# Patient Record
Sex: Female | Born: 1995 | Race: White | Hispanic: No | Marital: Single | Smoking: Never smoker
Health system: Southern US, Community
[De-identification: ages and names within clinical notes are randomized; demographics above are authoritative.]

## PROBLEM LIST (undated history)

## (undated) DIAGNOSIS — I10 Essential (primary) hypertension: Secondary | ICD-10-CM

---

## 2014-10-06 ENCOUNTER — Emergency Department (HOSPITAL_COMMUNITY)
Admission: EM | Admit: 2014-10-06 | Discharge: 2014-10-06 | Disposition: A | Discharge status: Home / Self Care | Payer: Medicaid Other | Attending: Emergency Medicine | Admitting: Emergency Medicine

## 2014-10-06 ENCOUNTER — Encounter (HOSPITAL_COMMUNITY): Payer: Self-pay | Admitting: Emergency Medicine

## 2014-10-06 DIAGNOSIS — Z3202 Encounter for pregnancy test, result negative: Secondary | ICD-10-CM | POA: Insufficient documentation

## 2014-10-06 DIAGNOSIS — I10 Essential (primary) hypertension: Secondary | ICD-10-CM | POA: Diagnosis not present

## 2014-10-06 DIAGNOSIS — Z880 Allergy status to penicillin: Secondary | ICD-10-CM | POA: Diagnosis not present

## 2014-10-06 DIAGNOSIS — R1010 Upper abdominal pain, unspecified: Secondary | ICD-10-CM | POA: Diagnosis present

## 2014-10-06 DIAGNOSIS — R1013 Epigastric pain: Secondary | ICD-10-CM | POA: Diagnosis not present

## 2014-10-06 DIAGNOSIS — R109 Unspecified abdominal pain: Secondary | ICD-10-CM

## 2014-10-06 HISTORY — DX: Essential (primary) hypertension: I10

## 2014-10-06 LAB — URINALYSIS, ROUTINE W REFLEX MICROSCOPIC
GLUCOSE, UA: 100 mg/dL — AB
HGB URINE DIPSTICK: NEGATIVE
Nitrite: POSITIVE — AB
PROTEIN: 30 mg/dL — AB
UROBILINOGEN UA: 4 mg/dL — AB (ref 0.0–1.0)
pH: 6.5 (ref 5.0–8.0)

## 2014-10-06 LAB — URINE MICROSCOPIC-ADD ON

## 2014-10-06 LAB — PREGNANCY, URINE: Preg Test, Ur: NEGATIVE

## 2014-10-06 MED ORDER — HYDROCODONE-ACETAMINOPHEN 5-325 MG PO TABS
ORAL_TABLET | ORAL | Status: AC
  Filled 2014-10-06: qty 1

## 2014-10-06 MED ORDER — HYDROCODONE-ACETAMINOPHEN 5-325 MG PO TABS
1.0000 | ORAL_TABLET | Freq: Once | ORAL | Status: AC
  Administered 2014-10-06: 1 via ORAL

## 2014-10-06 NOTE — Discharge Instructions (Signed)

## 2014-10-06 NOTE — ED Notes (Signed)
Went into room to start IV and draw blood -Pt declined, Stated that she really didn't want blood drawn - Lab informed

## 2014-10-06 NOTE — ED Notes (Signed)
Patient given discharge instruction, verbalized understand. Patient ambulatory out of the department.  

## 2014-10-06 NOTE — ED Notes (Signed)
Patient complaining of upper abdominal pain radiating to back. States started hurting her on Saturday. Reports has had problems with her gallbladder in the past.

## 2014-10-07 ENCOUNTER — Ambulatory Visit (HOSPITAL_COMMUNITY)
Admit: 2014-10-07 | Discharge: 2014-10-07 | Disposition: A | Discharge status: Home / Self Care | Payer: Medicaid Other | Source: Ambulatory Visit | Attending: Emergency Medicine | Admitting: Emergency Medicine

## 2014-10-07 DIAGNOSIS — K8020 Calculus of gallbladder without cholecystitis without obstruction: Secondary | ICD-10-CM | POA: Insufficient documentation

## 2014-10-07 DIAGNOSIS — K838 Other specified diseases of biliary tract: Secondary | ICD-10-CM | POA: Insufficient documentation

## 2014-10-07 DIAGNOSIS — R109 Unspecified abdominal pain: Secondary | ICD-10-CM

## 2014-10-07 DIAGNOSIS — R1032 Left lower quadrant pain: Secondary | ICD-10-CM | POA: Diagnosis not present

## 2014-10-07 DIAGNOSIS — R1031 Right lower quadrant pain: Secondary | ICD-10-CM | POA: Diagnosis not present

## 2014-10-07 DIAGNOSIS — R1011 Right upper quadrant pain: Secondary | ICD-10-CM | POA: Diagnosis present

## 2014-10-07 NOTE — ED Provider Notes (Signed)
Ultrasound of gallbladder shows cholelithiasis.  No acute abdomen. Patient referred to Dr. Franky Macho, general surgery. Recommended low-fat diet. Discussed with patient and her husband  Donnetta Hutching, MD 10/07/14 1057

## 2014-10-08 LAB — URINE CULTURE

## 2014-11-17 NOTE — ED Provider Notes (Signed)
CSN: [id number]     Arrival date & time 10/06/14  2022 History   First MD Initiated Contact with Patient 10/06/14 2217     Chief Complaint  Patient presents with  . Abdominal Pain     (Consider location/radiation/quality/duration/timing/severity/associated sxs/prior Treatment) HPI   Tammy Padilla is a 19 y.o. female who presents for evaluation of upper abdominal pain. Pain present for several days and intermittent. She has known gallbladder disease. No imaging has been in this facility. She denies fever, chills, nausea, vomiting, weakness or dizziness. There are no other known modifying factors.  Past Medical History  Diagnosis Date  . Hypertension    Past Surgical History  Procedure Laterality Date  . Cesarean section     History reviewed. No pertinent family history. Social History  Substance Use Topics  . Smoking status: Never Smoker   . Smokeless tobacco: None  . Alcohol Use: No   OB History    No data available     Review of Systems  All other systems reviewed and are negative.     Allergies  Penicillins  Home Medications   Prior to Admission medications   Not on File   BP 122/69 mmHg  Pulse 84  Temp(Src) 98.7 F (37.1 C) (Oral)  Resp 20  Ht 5\' 5"  (1.651 m)  Wt 248 lb 9.6 oz (112.764 kg)  BMI 41.37 kg/m2  SpO2 99%  LMP 08/05/2014 Physical Exam  Constitutional: She is oriented to person, place, and time. She appears well-developed and well-nourished.  HENT:  Head: Normocephalic and atraumatic.  Right Ear: External ear normal.  Left Ear: External ear normal.  Eyes: Conjunctivae and EOM are normal. Pupils are equal, round, and reactive to light.  Neck: Normal range of motion and phonation normal. Neck supple.  Cardiovascular: Normal rate, regular rhythm and normal heart sounds.   Pulmonary/Chest: Effort normal and breath sounds normal. She exhibits no bony tenderness.  Abdominal: Soft. There is tenderness (epigastric, mild).  Musculoskeletal: Normal  range of motion.  Neurological: She is alert and oriented to person, place, and time. No cranial nerve deficit or sensory deficit. She exhibits normal muscle tone. Coordination normal.  Skin: Skin is warm, dry and intact.  Psychiatric: She has a normal mood and affect. Her behavior is normal. Judgment and thought content normal.  Nursing note and vitals reviewed.   ED Course  Procedures (including critical care time) Medications  HYDROcodone-acetaminophen (NORCO/VICODIN) 5-325 MG per tablet 1 tablet (1 tablet Oral Given 10/06/14 2340)    No data found.  Screening evaluation ordered by Dr. Clarene DukeMcManus.  Patient decided she did not want to get her blood drawn, so chose to left without further evaluation.  Follow-up outpatient ultrasound ordered for next day.  Labs Review Labs Reviewed  URINALYSIS, ROUTINE W REFLEX MICROSCOPIC (NOT AT [facility]) - Abnormal; Notable for the following:    Color, Urine ORANGE (*)    APPearance HAZY (*)    Specific Gravity, Urine >1.030 (*)    Glucose, UA 100 (*)    Bilirubin Urine LARGE (*)    Ketones, ur TRACE (*)    Protein, ur 30 (*)    Urobilinogen, UA 4.0 (*)    Nitrite POSITIVE (*)    Leukocytes, UA MODERATE (*)    All other components within normal limits  URINE MICROSCOPIC-ADD ON - Abnormal; Notable for the following:    Squamous Epithelial / LPF MANY (*)    Bacteria, UA MANY (*)    Crystals CA OXALATE CRYSTALS (*)  All other components within normal limits  URINE CULTURE  PREGNANCY, URINE    Imaging Review No results found. I have personally reviewed and evaluated these images and lab results as part of my medical decision-making.   EKG Interpretation None      MDM   Final diagnoses:  Flank pain    Nonspecific upper abdominal pain with possibility for gallstone disease. No overt toxic metabolic processes.  Patient is choosing to leave and have further evaluation done as an outpatient.  Nursing Notes Reviewed/ Care  Coordinated Applicable Imaging Reviewed Interpretation of Laboratory Data incorporated into ED treatment  The patient appears reasonably screened and/or stabilized for discharge and I doubt any other medical condition or other [facility] requiring further screening, evaluation, or treatment in the ED at this time prior to discharge.  Plan: Home Medications- none; Home Treatments- rest; return here if the recommended treatment, does not improve the symptoms; Recommended follow up- OP Abdominal U/S, next day     Mancel Bale, MD 11/17/14 [phone]

## 2016-04-26 IMAGING — US US ABDOMEN COMPLETE
1 series · 13 of 25 positions shown · non-contrast
Comparison: None.

CLINICAL DATA: Right upper quadrant, right flank pain, and left
flank pain. History of nausea vomiting.

EXAM:
ULTRASOUND ABDOMEN COMPLETE

[Series 1: us abdomen complete · 0.21mm/px · 13 of 136 slices shown]
[im 1/136]
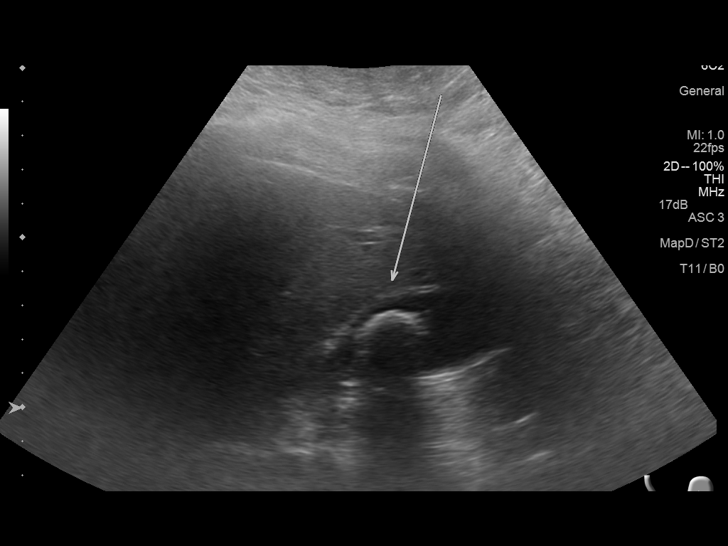
[im 12/136]
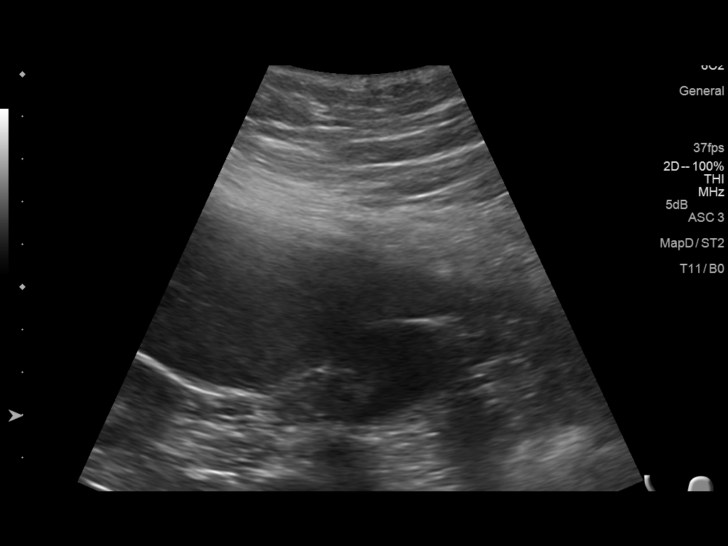
[im 23/136]
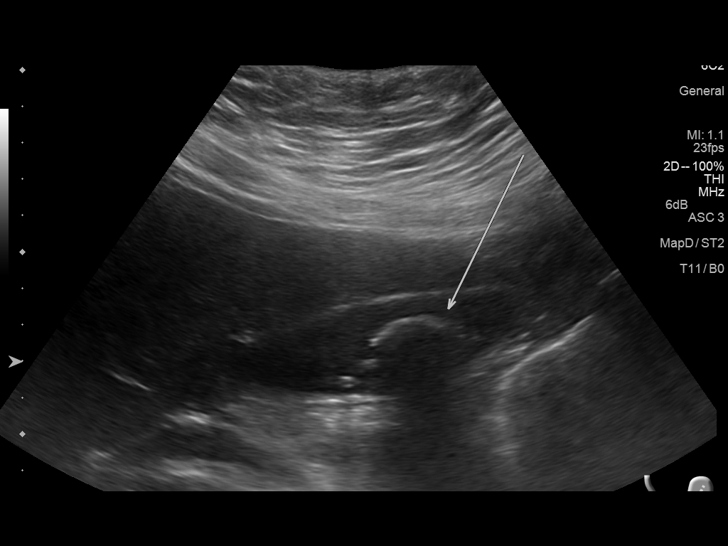
[im 34/136]
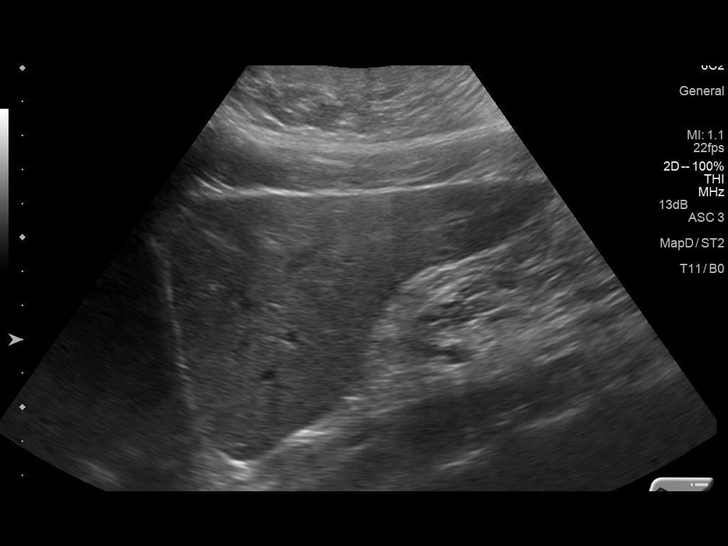
[im 46/136]
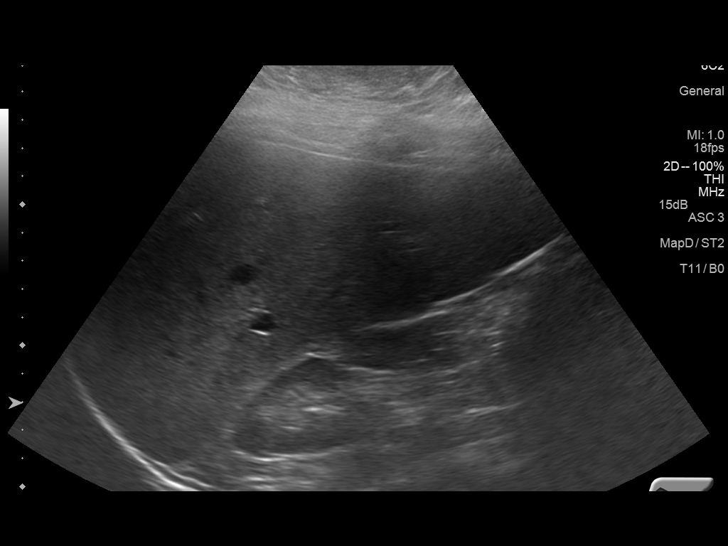
[im 57/136]
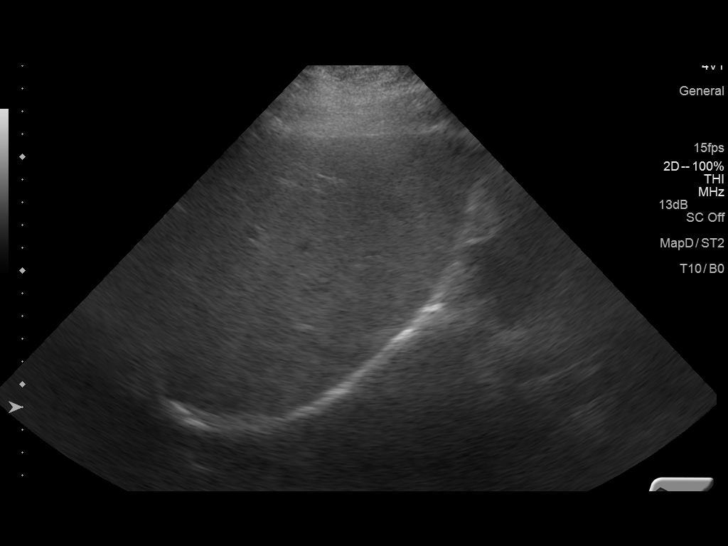
[im 68/136]
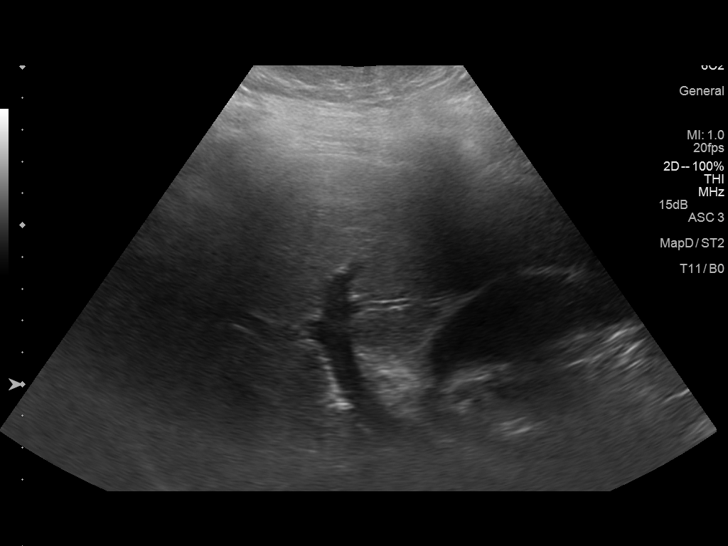
[im 79/136]
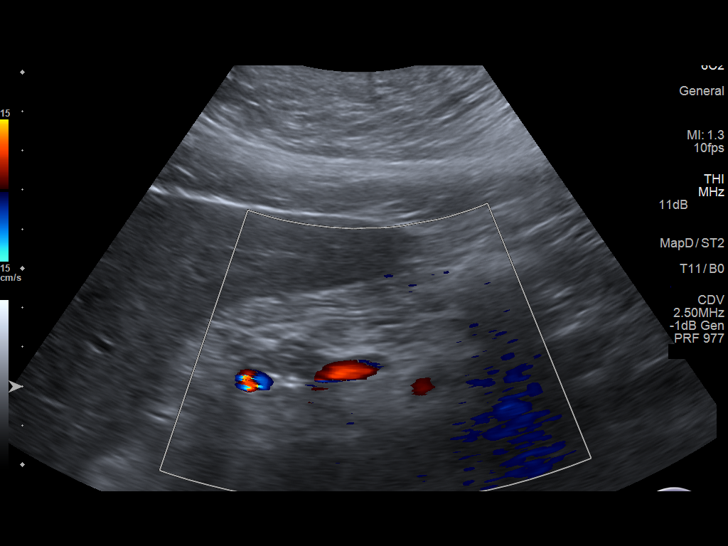
[im 91/136]
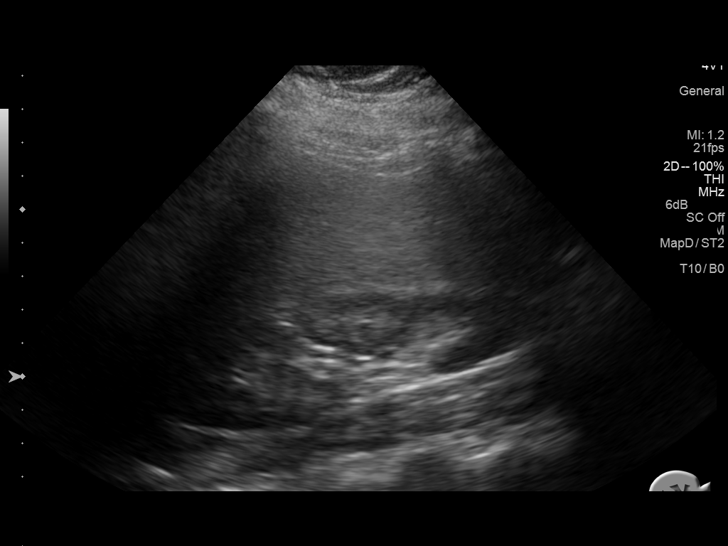
[im 102/136]
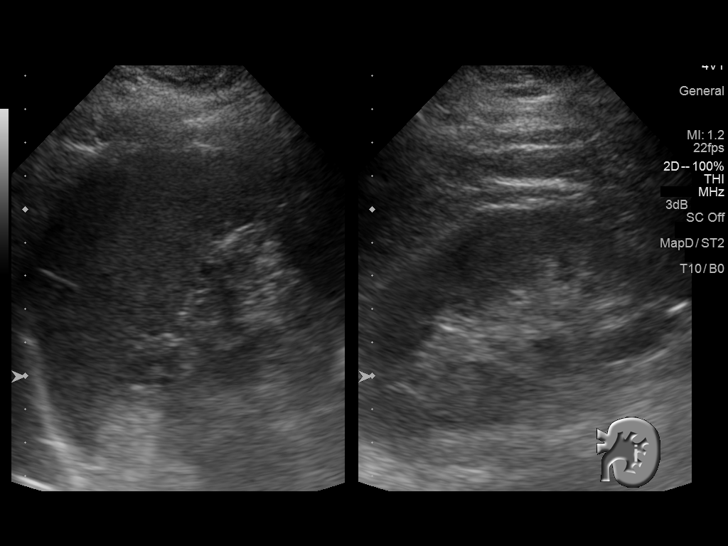
[im 113/136]
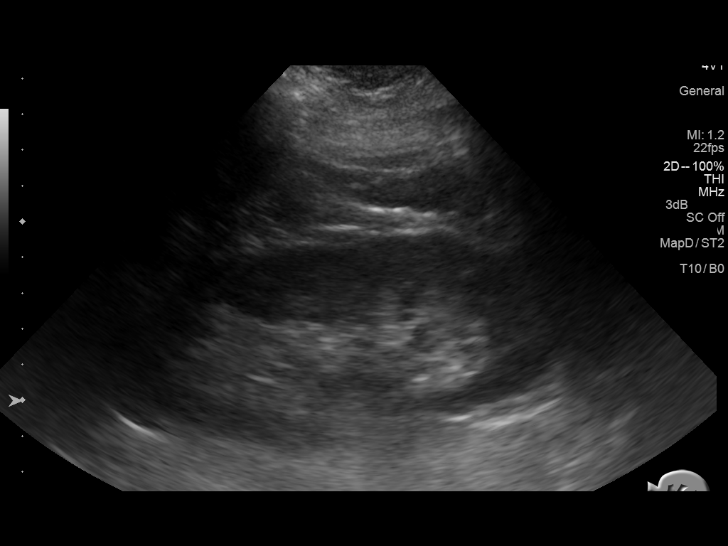
[im 124/136]
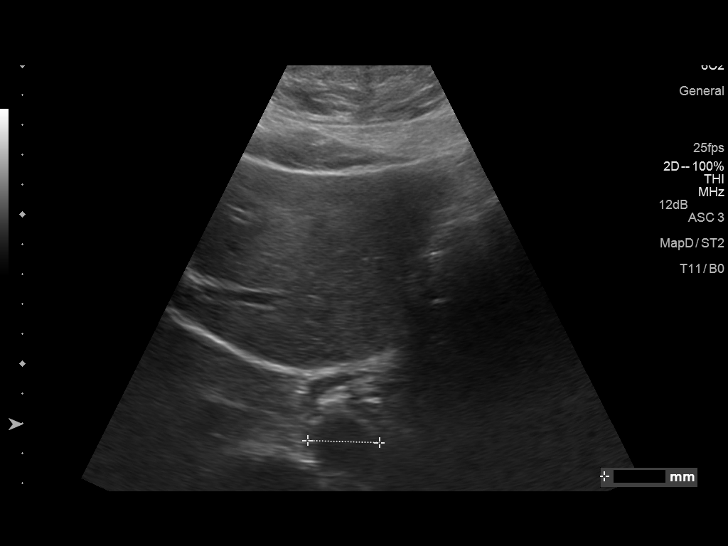
[im 136/136]
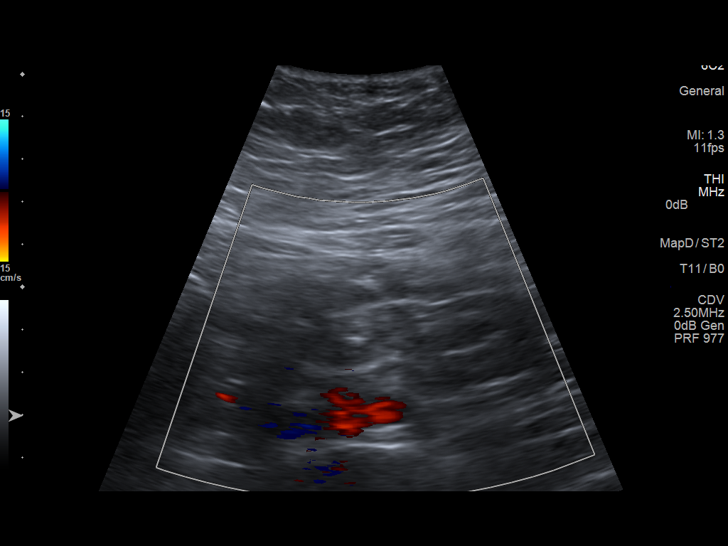

[13 of 25 positions shown; findings below may reference images not displayed]

FINDINGS: Gallbladder: There are multiple mobile gallstones the largest of
which measures 2.2 cm. Layering with changing of positioning is
noted. Sonographic Murphy's sign is negative. There is marginal
thickening of the gallbladder wall which measures 3.4 mm.

Common bile duct: Diameter:

Liver: No focal lesion identified. Within normal limits in
parenchymal echogenicity.

IVC: No abnormality visualized.

Pancreas: Visualized portion unremarkable. Pancreatic tail is
obscured by bowel gas.

Spleen: Size and appearance within normal limits, measuring 8.4 cm
in length.

Right Kidney: Length: 12.0. Echogenicity within normal limits. No
mass or hydronephrosis visualized.

Left Kidney: Length: 13.1. Echogenicity within normal limits. No
mass or hydronephrosis visualized.

Abdominal aorta: Partially obscured by bowel gas. No aneurysm where
visualized. Proximal abdominal aorta measures 2.2 cm in transverse
diameter.

Other findings: None.
IMPRESSION: Cholelithiasis. Mild thickening of the gallbladder wall, which may
be seen with acute cholecystitis, however there is no evidence of
pericholecystic fluid, and the sonographic Murphy sign is negative,
findings which argue against acute cholecystitis.

Common bowel duct measures 6.4 cm which is enlarged for patient's
age.

Given the inconclusive findings, nuclear medicine scintigraphy may
be considered.
